# Patient Record
Sex: Male | Born: 1985 | Race: White | Hispanic: No | Marital: Married | Smoking: Never smoker
Health system: Southern US, Community
[De-identification: ages and names within clinical notes are randomized; demographics above are authoritative.]

## PROBLEM LIST (undated history)

## (undated) HISTORY — PX: APPENDECTOMY: SHX54

---

## 2013-09-10 ENCOUNTER — Emergency Department (HOSPITAL_BASED_OUTPATIENT_CLINIC_OR_DEPARTMENT_OTHER): Payer: Self-pay

## 2013-09-10 ENCOUNTER — Encounter (HOSPITAL_BASED_OUTPATIENT_CLINIC_OR_DEPARTMENT_OTHER): Payer: Self-pay | Admitting: Emergency Medicine

## 2013-09-10 ENCOUNTER — Emergency Department (HOSPITAL_BASED_OUTPATIENT_CLINIC_OR_DEPARTMENT_OTHER)
Admission: EM | Admit: 2013-09-10 | Discharge: 2013-09-10 | Disposition: A | Discharge status: Home / Self Care | Payer: Self-pay | Attending: Emergency Medicine | Admitting: Emergency Medicine

## 2013-09-10 DIAGNOSIS — Y929 Unspecified place or not applicable: Secondary | ICD-10-CM | POA: Insufficient documentation

## 2013-09-10 DIAGNOSIS — S62319A Displaced fracture of base of unspecified metacarpal bone, initial encounter for closed fracture: Secondary | ICD-10-CM | POA: Insufficient documentation

## 2013-09-10 DIAGNOSIS — Y9389 Activity, other specified: Secondary | ICD-10-CM | POA: Insufficient documentation

## 2013-09-10 DIAGNOSIS — S92301A Fracture of unspecified metatarsal bone(s), right foot, initial encounter for closed fracture: Secondary | ICD-10-CM

## 2013-09-10 DIAGNOSIS — W2209XA Striking against other stationary object, initial encounter: Secondary | ICD-10-CM | POA: Insufficient documentation
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
## 2013-09-10 NOTE — ED Provider Notes (Signed)
CSN: [id number]     Arrival date & time 09/10/13  1204 History   First MD Initiated Contact with Patient 09/10/13 1306     Chief Complaint  Patient presents with  . Hand Injury   (Consider location/radiation/quality/duration/timing/severity/associated sxs/prior Treatment) Patient is a 27 y.o. male presenting with hand injury. The history is provided by the patient.  Hand Injury Location:  Hand Time since incident:  2 months Hand location:  R hand Pain details:    Quality:  Aching, dull and throbbing   Radiates to:  Does not radiate   Severity:  Moderate   Onset quality:  Sudden   Duration:  2 months   Timing:  Intermittent   Progression:  Waxing and waning Chronicity:  New Handedness:  Right-handed Dislocation: no   Foreign body present:  No foreign bodies Prior injury to area:  No Relieved by:  Being still and acetaminophen Worsened by:  Exercise, movement and bearing weight Risk factors: no concern for non-accidental trauma, no known bone disorder, no frequent fractures and no recent illness    Patient punched wall 2 months ago. Has had constant pain in right hand since. Denies weakness.   No past medical history on file. Past Surgical History  Procedure Laterality Date  . Appendectomy     No family history on file. History  Substance Use Topics  . Smoking status: Never Smoker   . Smokeless tobacco: Not on file  . Alcohol Use: Not on file    Review of Systems  Allergies  Review of patient's allergies indicates no known allergies.  Home Medications  No current outpatient prescriptions on file. BP 142/78  Pulse 90  Temp(Src) 98.2 F (36.8 C) (Oral)  Resp 18  Ht 5\' 10"  (1.778 m)  Wt 205 lb (92.987 kg)  BMI 29.41 kg/m2  SpO2 100% Physical Exam  Musculoskeletal:       Left hand: He exhibits tenderness and bony tenderness. He exhibits normal range of motion, normal two-point discrimination, no deformity, no laceration and no swelling. Normal sensation noted.  Normal strength noted.       Hands: Tender to palpation of right hand along the 4th metacarpal. Full strength. Pulses 2+. Intact to sensation.    ED Course  Procedures (including critical care time) Labs Review Labs Reviewed - No data to display Imaging Review Dg Hand Complete Right  09/10/2013   CLINICAL DATA:  Pain post injury  EXAM: RIGHT HAND - COMPLETE 3+ VIEW  COMPARISON:  None.  FINDINGS: Three views of the right hand submitted. There is mild impacted fracture distal aspect 4th metacarpal with healing response noted.  IMPRESSION: Mild impacted fracture distal aspect 4th metacarpal with healing response/ callus formation.   Electronically Signed   By: Natasha Mead M.D.   On: 09/10/2013 12:31    EKG Interpretation   None       MDM   1. Fracture of 4th metatarsal, right, closed, initial encounter    The patient has a boxer's fracture that appears to be healing, but has not completely resolved. Recommended patient to have a ulnar gutter splint and follow-up with Hand specialist as outpatient. He is in agreement with that plan. He is in a rehab facility and cannot take narcotics. I recommended that the patient take tylenol as needed for pain. He is in agreement with that plan.     Pleas Koch, MD 09/10/13 1436
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
## 2018-04-09 ENCOUNTER — Encounter (HOSPITAL_BASED_OUTPATIENT_CLINIC_OR_DEPARTMENT_OTHER): Payer: Self-pay | Admitting: Emergency Medicine

## 2018-04-09 ENCOUNTER — Emergency Department (HOSPITAL_BASED_OUTPATIENT_CLINIC_OR_DEPARTMENT_OTHER)
Admission: EM | Admit: 2018-04-09 | Discharge: 2018-04-09 | Disposition: A | Discharge status: Home / Self Care | Payer: Worker's Compensation | Attending: Emergency Medicine | Admitting: Emergency Medicine

## 2018-04-09 ENCOUNTER — Emergency Department (HOSPITAL_BASED_OUTPATIENT_CLINIC_OR_DEPARTMENT_OTHER): Payer: Worker's Compensation

## 2018-04-09 ENCOUNTER — Other Ambulatory Visit: Payer: Self-pay

## 2018-04-09 DIAGNOSIS — Y9289 Other specified places as the place of occurrence of the external cause: Secondary | ICD-10-CM | POA: Insufficient documentation

## 2018-04-09 DIAGNOSIS — Y999 Unspecified external cause status: Secondary | ICD-10-CM | POA: Insufficient documentation

## 2018-04-09 DIAGNOSIS — Y939 Activity, unspecified: Secondary | ICD-10-CM | POA: Insufficient documentation

## 2018-04-09 DIAGNOSIS — X58XXXA Exposure to other specified factors, initial encounter: Secondary | ICD-10-CM | POA: Insufficient documentation

## 2018-04-09 DIAGNOSIS — S161XXA Strain of muscle, fascia and tendon at neck level, initial encounter: Secondary | ICD-10-CM | POA: Insufficient documentation

## 2018-04-09 DIAGNOSIS — M545 Low back pain, unspecified: Secondary | ICD-10-CM

## 2018-04-09 DIAGNOSIS — S93401A Sprain of unspecified ligament of right ankle, initial encounter: Secondary | ICD-10-CM

## 2018-04-09 MED ORDER — IBUPROFEN 800 MG PO TABS: 800.0000 mg | ORAL_TABLET | Freq: Three times a day (TID) | ORAL | 0 refills | Status: DC | PRN

## 2018-04-09 MED ORDER — METHOCARBAMOL 500 MG PO TABS: 500.0000 mg | ORAL_TABLET | Freq: Four times a day (QID) | ORAL | 0 refills | Status: DC | PRN

## 2018-04-09 MED ORDER — IBUPROFEN 800 MG PO TABS: 800.0000 mg | ORAL_TABLET | Freq: Once | ORAL | Status: DC

## 2018-04-09 NOTE — ED Notes (Signed)
Pt given d/c instructions as per chart. Rx x 2 with precautions. Verbalizes understanding. No questions. 

## 2018-04-09 NOTE — ED Notes (Signed)
PMS intact before and after. Pt tolerated well. All questions answered. 

## 2018-04-09 NOTE — Discharge Instructions (Signed)
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
## 2019-06-05 ENCOUNTER — Encounter: Payer: Self-pay | Admitting: Emergency Medicine

## 2019-06-05 ENCOUNTER — Other Ambulatory Visit: Payer: Self-pay

## 2019-06-05 ENCOUNTER — Emergency Department
Admission: EM | Admit: 2019-06-05 | Discharge: 2019-06-05 | Disposition: A | Discharge status: Home / Self Care | Payer: BC Managed Care – PPO | Attending: Emergency Medicine | Admitting: Emergency Medicine

## 2019-06-05 DIAGNOSIS — H9201 Otalgia, right ear: Secondary | ICD-10-CM

## 2019-06-05 DIAGNOSIS — H6501 Acute serous otitis media, right ear: Secondary | ICD-10-CM | POA: Diagnosis not present

## 2019-06-05 MED ORDER — AMOXICILLIN 875 MG PO TABS: 875.0000 mg | ORAL_TABLET | Freq: Two times a day (BID) | ORAL | 0 refills | Status: DC

## 2019-06-05 MED ORDER — NAPROXEN 500 MG PO TABS: 500.0000 mg | ORAL_TABLET | Freq: Two times a day (BID) | ORAL | Status: DC

## 2019-06-05 MED ORDER — FEXOFENADINE-PSEUDOEPHED ER 60-120 MG PO TB12: 1.0000 | ORAL_TABLET | Freq: Two times a day (BID) | ORAL | 0 refills | Status: DC

## 2019-06-05 NOTE — ED Triage Notes (Signed)
Pt states right earache for last 3 days and feels like it is swollen shut.

## 2019-06-05 NOTE — ED Provider Notes (Signed)
[facility] Emergency Department Provider Note   ____________________________________________   First MD Initiated Contact with Patient 06/05/19 1236     (approximate)  I have reviewed the triage vital signs and the nursing notes.   HISTORY  Chief Complaint Otalgia    HPI Jeremy Waller is a 33 y.o. male patient complain of right ear pain for 3 days.  Patient state mild hearing loss.  Patient also complained of nasal congestion.  Patient denies fever chills associated this complaint.  Patient denies other URI signs and symptoms.  Patient denies vertigo.  Patient denies immersion in water.  Patient rates pain as a 9/10.  Patient described pain is "achy/pressure".  No palliative measure for complaint.         History reviewed. No pertinent past medical history.  There are no active problems to display for this patient.   Past Surgical History:  Procedure Laterality Date   APPENDECTOMY      Prior to Admission medications   Medication Sig Start Date End Date Taking? Authorizing Provider  amoxicillin (AMOXIL) 875 MG tablet Take 1 tablet (875 mg total) by mouth 2 (two) times daily. 06/05/19   Joni ReiningSmith, Zell Doucette K, PA-C  fexofenadine-pseudoephedrine (ALLEGRA-D) 60-120 MG 12 hr tablet Take 1 tablet by mouth 2 (two) times daily. 06/05/19   Joni ReiningSmith, Lyzbeth Genrich K, PA-C  naproxen (NAPROSYN) 500 MG tablet Take 1 tablet (500 mg total) by mouth 2 (two) times daily with a meal. 06/05/19   Joni ReiningSmith, Adden Strout K, PA-C    Allergies Patient has no known allergies.  No family history on file.  Social History Social History   Tobacco Use   Smoking status: Never Smoker   Smokeless tobacco: Never Used  Substance Use Topics   Alcohol use: Not Currently   Drug use: Not Currently    Review of Systems Constitutional: No fever/chills Eyes: No visual changes. ENT: No sore throat.  Right ear pain and nasal congestion. Cardiovascular: Denies chest pain. Respiratory: Denies  shortness of breath. Gastrointestinal: No abdominal pain.  No nausea, no vomiting.  No diarrhea.  No constipation. Genitourinary: Negative for dysuria. Musculoskeletal: Negative for back pain. Skin: Negative for rash. Neurological: Negative for headaches, focal weakness or numbness.   ____________________________________________   PHYSICAL EXAM:  VITAL SIGNS: ED Triage Vitals  Enc Vitals Group     BP 06/05/19 1211 116/64     Pulse Rate 06/05/19 1211 88     Resp 06/05/19 1211 16     Temp 06/05/19 1211 98.3 F (36.8 C)     Temp Source 06/05/19 1211 Oral     SpO2 06/05/19 1211 100 %     Weight 06/05/19 1154 220 lb (99.8 kg)     Height 06/05/19 1154 5\' 10"  (1.778 m)     Head Circumference --      Peak Flow --      Pain Score 06/05/19 1154 9     Pain Loc --      Pain Edu? --      Excl. in GC? --     Constitutional: Alert and oriented. Well appearing and in no acute distress. Nose: No congestion/rhinnorhea.  Right maxillary guarding. EARS: Edematous erythematous right TM. Mouth/Throat: Mucous membranes are moist.  Oropharynx non-erythematous. Neck: No stridor.   Hematological/Lymphatic/Immunilogical: No cervical lymphadenopathy. Cardiovascular: Normal rate, regular rhythm. Grossly normal heart sounds.  Good peripheral circulation. Respiratory: Normal respiratory effort.  No retractions. Lungs CTAB. Skin:  Skin is warm, dry and intact. No rash noted.  Psychiatric: Mood and affect are normal. Speech and behavior are normal.  ____________________________________________   LABS (all labs ordered are listed, but only abnormal results are displayed)  Labs Reviewed - No data to display ____________________________________________  EKG   ____________________________________________  RADIOLOGY  ED MD interpretation:    Official radiology report(s): No results found.  ____________________________________________   PROCEDURES  Procedure(s) performed (including  Critical Care):  Procedures   ____________________________________________   INITIAL IMPRESSION / ASSESSMENT AND PLAN / ED COURSE  As part of my medical decision making, I reviewed the following data within the electronic MEDICAL RECORD NUMBER         Victorious Kundinger was evaluated in Emergency Department on 06/05/2019 for the symptoms described in the history of present illness. He was evaluated in the context of the global COVID-19 pandemic, which necessitated consideration that the patient might be at risk for infection with the SARS-CoV-2 virus that causes COVID-19. Institutional protocols and algorithms that pertain to the evaluation of patients at risk for COVID-19 are in a state of rapid change based on information released by regulatory bodies including the CDC and federal and state organizations. These policies and algorithms were followed during the patient's care in the ED.   Patient complain of 3 days of right ear pain.  Patient patient also state resolving nasal congestion.  Denies URI signs and symptoms.  Patient physical exam consistent with otitis media.  Patient given discharge care instruction advised take medication as directed.  Advised follow-up PCP.     ____________________________________________   FINAL CLINICAL IMPRESSION(S) / ED DIAGNOSES  Final diagnoses:  Otalgia of right ear  Non-recurrent acute serous otitis media of right ear     ED Discharge Orders         Ordered    amoxicillin (AMOXIL) 875 MG tablet  2 times daily     06/05/19 1242    fexofenadine-pseudoephedrine (ALLEGRA-D) 60-120 MG 12 hr tablet  2 times daily     06/05/19 1242    naproxen (NAPROSYN) 500 MG tablet  2 times daily with meals     06/05/19 1242           Note:  This document was prepared using Dragon voice recognition software and may include unintentional dictation errors.    Sable Feil, PA-C 06/05/19 1248    Blake Divine, MD 06/05/19 [phone]

## 2019-06-05 NOTE — ED Notes (Signed)
See triage note  States he is having right ear pain with some swelling   Became worse during the night   Has had some sinus and chest congestion prior

## 2019-07-16 ENCOUNTER — Encounter: Payer: Self-pay | Admitting: Emergency Medicine

## 2019-07-16 ENCOUNTER — Emergency Department
Admission: EM | Admit: 2019-07-16 | Discharge: 2019-07-16 | Disposition: A | Discharge status: Home / Self Care | Payer: BC Managed Care – PPO | Attending: Student | Admitting: Student

## 2019-07-16 ENCOUNTER — Other Ambulatory Visit: Payer: Self-pay

## 2019-07-16 DIAGNOSIS — Z2914 Encounter for prophylactic rabies immune globin: Secondary | ICD-10-CM | POA: Diagnosis not present

## 2019-07-16 DIAGNOSIS — Z23 Encounter for immunization: Secondary | ICD-10-CM | POA: Insufficient documentation

## 2019-07-16 DIAGNOSIS — Z203 Contact with and (suspected) exposure to rabies: Secondary | ICD-10-CM | POA: Diagnosis not present

## 2019-07-16 MED ORDER — RABIES VACCINE, PCEC IM SUSR
1.0000 mL | Freq: Once | INTRAMUSCULAR | Status: AC
  Administered 2019-07-16: 19:00:00 1 mL via INTRAMUSCULAR
  Filled 2019-07-16: qty 1

## 2019-07-16 MED ORDER — RABIES IMMUNE GLOBULIN 150 UNIT/ML IM INJ
20.0000 [IU]/kg | INJECTION | Freq: Once | INTRAMUSCULAR | Status: AC
  Administered 2019-07-16: 19:00:00 1950 [IU] via INTRAMUSCULAR
  Filled 2019-07-16: qty 13

## 2019-07-16 NOTE — ED Provider Notes (Signed)
[facility] Emergency Department Provider Note  ____________________________________________   First MD Initiated Contact with Patient 07/16/19 1745     (approximate)  I have reviewed the triage vital signs and the nursing notes.   HISTORY  Chief Complaint Rabies Injection    HPI Jeremy Waller is a 33 y.o. male presents emergency department stating that animal control told him he needs to get rabies vaccines.  He states that he [place] a living.  He was at work and saw a Set designer and picked it up.  He handled the raccoon and they had sent off to be tested for rabies.  Animal control called told him the test results would not be back until next Friday so they encouraged him to come here to get rabies vaccines.  Patient has no known allergies.  The raccoon did not bite him.    History reviewed. No pertinent past medical history.  There are no active problems to display for this patient.   Past Surgical History:  Procedure Laterality Date  . APPENDECTOMY      Prior to Admission medications   Not on File    Allergies Patient has no known allergies.  No family history on file.  Social History Social History   Tobacco Use  . Smoking status: Never Smoker  . Smokeless tobacco: Never Used  Substance Use Topics  . Alcohol use: Not Currently  . Drug use: Not Currently    Review of Systems  Constitutional: No fever/chills, here for rabies vaccines Eyes: No visual changes. ENT: No sore throat. Respiratory: Denies cough Genitourinary: Negative for dysuria. Musculoskeletal: Negative for back pain. Skin: Negative for rash.    ____________________________________________   PHYSICAL EXAM:  VITAL SIGNS: ED Triage Vitals  Enc Vitals Group     BP 07/16/19 1730 135/69     Pulse Rate 07/16/19 1730 86     Resp 07/16/19 1730 18     Temp 07/16/19 1730 98.1 F (36.7 C)     Temp Source 07/16/19 1730 Oral     SpO2 07/16/19 1730  100 %     Weight 07/16/19 1733 220 lb (99.8 kg)     Height 07/16/19 1733 5\' 11"  (1.803 m)     Head Circumference --      Peak Flow --      Pain Score 07/16/19 1732 0     Pain Loc --      Pain Edu? --      Excl. in [place]? --     Constitutional: Alert and oriented. Well appearing and in no acute distress. Eyes: Conjunctivae are normal.  Head: Atraumatic. Nose: No congestion/rhinnorhea. Mouth/Throat: Mucous membranes are moist.   Neck:  supple no lymphadenopathy noted Cardiovascular: Normal rate, regular rhythm. Heart sounds are normal Respiratory: Normal respiratory effort.  No retractions, lungs c t a  GU: deferred Musculoskeletal: FROM all extremities, warm and well perfused Neurologic:  Normal speech and language.  Skin:  Skin is warm, dry and intact. No rash noted. Psychiatric: Mood and affect are normal. Speech and behavior are normal.  ____________________________________________   LABS (all labs ordered are listed, but only abnormal results are displayed)  Labs Reviewed - No data to display ____________________________________________   ____________________________________________  RADIOLOGY    ____________________________________________   PROCEDURES  Procedure(s) performed: Rabies vaccine   Procedures    ____________________________________________   INITIAL IMPRESSION / ASSESSMENT AND PLAN / ED COURSE  Pertinent labs & imaging results that were available during  my care of the patient were reviewed by me and considered in my medical decision making (see chart for details).   Patient is 33 year old male presents emergency department with concerns of handling a rabid raccoon.  States it was a Dietitianbaby raccoon he handled it but they have sent it off to be tested.  He states that the animal control called him and states the results would not be back until next Friday so they encouraged him to come and start rabies vaccinations.  Patient appears well.  No bite  marks noted.  Explained the process to the patient.  Explained to him these are several shots not just 1 shot.  He states he understands.  He will comply with the rabies vaccine schedule. Rabies immunoglobulin and RabAvert were both ordered. Rabies s schedule was explained to the patient.  He was discharged in stable condition    Jeremy Waller was evaluated in Emergency Department on 07/16/2019 for the symptoms described in the history of present illness. He was evaluated in the context of the global COVID-19 pandemic, which necessitated consideration that the patient might be at risk for infection with the SARS-CoV-2 virus that causes COVID-19. Institutional protocols and algorithms that pertain to the evaluation of patients at risk for COVID-19 are in a state of rapid change based on information released by regulatory bodies including the CDC and federal and state organizations. These policies and algorithms were followed during the patient's care in the ED.   As part of my medical decision making, I reviewed the following data within the electronic MEDICAL RECORD NUMBER Nursing notes reviewed and incorporated, Old chart reviewed, Notes from prior ED visits and [place] Controlled Substance Database  ____________________________________________   FINAL CLINICAL IMPRESSION(S) / ED DIAGNOSES  Final diagnoses:  Rabies contact  Need for prophylactic vaccination against rabies      NEW MEDICATIONS STARTED DURING THIS VISIT:  Current Discharge Medication List       Note:  This document was prepared using Dragon voice recognition software and may include unintentional dictation errors.    Faythe GheeFisher, Shani Fitch W, PA-C 07/16/19 Arsenio [id number]    Monks, Sarah L., MD 07/17/19 [phone]

## 2019-07-16 NOTE — Discharge Instructions (Signed)
Return to the emergency department on 07/19/2019, 07/23/2019, and 07/30/2019.  Take tylenol for pain if needed.

## 2019-07-16 NOTE — ED Notes (Signed)
Patient denies pain and is resting comfortably.  

## 2019-07-16 NOTE — ED Triage Notes (Signed)
Pt found baby raccoon while at work on Friday, states he handled the raccoon and was sent off to be tested for rabies.  Pt was advised to come have rabies injection.

## 2019-07-16 NOTE — ED Notes (Signed)
Pt denies pain a this time. Pt st he grabbed a racoon while at work with a t-shirt, pt denies scratches or bitten by [place]. Pt was told to come to ED for a rabies shot.

## 2021-11-27 ENCOUNTER — Encounter (HOSPITAL_COMMUNITY): Payer: Self-pay

## 2021-11-27 ENCOUNTER — Emergency Department (HOSPITAL_COMMUNITY)
Admission: EM | Admit: 2021-11-27 | Discharge: 2021-11-27 | Disposition: A | Discharge status: Home / Self Care | Payer: BC Managed Care – PPO | Attending: Emergency Medicine | Admitting: Emergency Medicine

## 2021-11-27 DIAGNOSIS — R1032 Left lower quadrant pain: Secondary | ICD-10-CM | POA: Insufficient documentation

## 2021-11-27 DIAGNOSIS — R0602 Shortness of breath: Secondary | ICD-10-CM | POA: Diagnosis not present

## 2021-11-27 DIAGNOSIS — R079 Chest pain, unspecified: Secondary | ICD-10-CM | POA: Insufficient documentation

## 2021-11-27 DIAGNOSIS — Z5321 Procedure and treatment not carried out due to patient leaving prior to being seen by health care provider: Secondary | ICD-10-CM | POA: Diagnosis not present

## 2021-11-27 NOTE — ED Triage Notes (Signed)
Patient arrives from home with c/o of LLQ abdominal pain and left sided chest pain that began today. Pt also endorses SOB.

## 2021-11-27 NOTE — ED Notes (Signed)
Patient went to bathroom, upon return patient stating "did somebody call on my HIPAA" pt informed that noone has called. Pt stating "are you sure because I heard someone on the screen", patient reassured that noone has called in regard to his care. Patient then walked out stating he would just be seen somewhere else.

## 2021-11-27 NOTE — ED Notes (Signed)
Pt refuse labs in triage state he does not need any blood draw just need a CT test. State he had blood work done not too long ago but does not remember the date. Triage RN made aware

## 2023-11-03 DEATH — deceased
